# Patient Record
Sex: Male | Born: 1965 | Race: White | Hispanic: No | Marital: Married | Smoking: Never smoker
Health system: Southern US, Community
[De-identification: ages and names within clinical notes are randomized; demographics above are authoritative.]

## PROBLEM LIST (undated history)

## (undated) DIAGNOSIS — I10 Essential (primary) hypertension: Secondary | ICD-10-CM

## (undated) DIAGNOSIS — E785 Hyperlipidemia, unspecified: Secondary | ICD-10-CM

---

## 2005-03-07 ENCOUNTER — Emergency Department (HOSPITAL_COMMUNITY): Admission: EM | Admit: 2005-03-07 | Discharge: 2005-03-08 | Payer: Self-pay | Admitting: *Deleted

## 2005-06-09 ENCOUNTER — Other Ambulatory Visit: Admission: RE | Admit: 2005-06-09 | Discharge: 2005-06-09 | Payer: Self-pay | Admitting: Dermatology

## 2006-09-22 ENCOUNTER — Ambulatory Visit (HOSPITAL_COMMUNITY): Admission: RE | Admit: 2006-09-22 | Discharge: 2006-09-22 | Payer: Self-pay | Admitting: *Deleted

## 2012-09-06 ENCOUNTER — Encounter (INDEPENDENT_AMBULATORY_CARE_PROVIDER_SITE_OTHER): Payer: Self-pay | Admitting: General Surgery

## 2012-09-11 ENCOUNTER — Ambulatory Visit (INDEPENDENT_AMBULATORY_CARE_PROVIDER_SITE_OTHER): Payer: BC Managed Care – PPO | Admitting: General Surgery

## 2012-09-13 ENCOUNTER — Encounter (INDEPENDENT_AMBULATORY_CARE_PROVIDER_SITE_OTHER): Payer: Self-pay | Admitting: General Surgery

## 2012-09-24 ENCOUNTER — Ambulatory Visit (INDEPENDENT_AMBULATORY_CARE_PROVIDER_SITE_OTHER): Payer: BC Managed Care – PPO | Admitting: General Surgery

## 2012-09-24 ENCOUNTER — Encounter (INDEPENDENT_AMBULATORY_CARE_PROVIDER_SITE_OTHER): Payer: Self-pay | Admitting: General Surgery

## 2012-09-24 VITALS — BP 140/98 | HR 78 | Temp 98.2°F | Resp 18 | Ht 69.5 in | Wt 199.0 lb

## 2012-09-24 DIAGNOSIS — K4090 Unilateral inguinal hernia, without obstruction or gangrene, not specified as recurrent: Secondary | ICD-10-CM

## 2012-09-24 NOTE — Progress Notes (Signed)
Patient ID: Russell Cisneros, male   DOB: 03-Dec-1965, 47 y.o.   MRN: [id number]  No chief complaint on file.   HPI Russell Cisneros is a 47 y.o. male.  The patient is a 47 year old male who is referred by Dr. Elfredia Nevins for an evaluation of a right inguinal hernia. Patient states he's had this for several months. It is a burning sensation and on his right inguinal area. He describes no signs or symptoms of incarceration. There is no provoking or relieving factors. 2 HPI  No past medical history on file.  No past surgical history on file.  Family History  Problem Relation Age of Onset  . Heart disease Father     heart attack    Social History History  Substance Use Topics  . Smoking status: Not on file  . Smokeless tobacco: Not on file  . Alcohol Use: Not on file    No Known Allergies  Current Outpatient Prescriptions  Medication Sig Dispense Refill  . aspirin 81 MG tablet Take 81 mg by mouth daily.      . fish oil-omega-3 fatty acids 1000 MG capsule Take 2 g by mouth daily.      . Multiple Vitamins-Minerals (MULTIVITAMIN WITH MINERALS) tablet Take 1 tablet by mouth daily.      . rosuvastatin (CRESTOR) 20 MG tablet Take 20 mg by mouth daily.       No current facility-administered medications for this visit.    Review of Systems Review of Systems  Constitutional: Negative.   Eyes: Negative.   Respiratory: Negative.   Cardiovascular: Negative.   Allergic/Immunologic: Negative.   Neurological: Negative.     Blood pressure 140/98, pulse 78, temperature 98.2 F (36.8 C), temperature source Temporal, resp. rate 18, height 5' 9.5" (1.765 m), weight 199 lb (90.266 kg).  Physical Exam Physical Exam  Constitutional: He is oriented to person, place, and time. He appears well-developed and well-nourished.  HENT:  Head: Normocephalic.  Eyes: Conjunctivae and EOM are normal. Pupils are equal, round, and reactive to light.  Neck: Normal range of motion.  Cardiovascular: Normal  rate and normal heart sounds.   Pulmonary/Chest: Effort normal and breath sounds normal.  Abdominal: Soft. Bowel sounds are normal. A hernia is present. Hernia confirmed positive in the right inguinal area. Hernia confirmed negative in the left inguinal area.  Musculoskeletal: Normal range of motion.  Neurological: He is alert and oriented to person, place, and time.    Data Reviewed none  Assessment    47 year old male with a right likely direct inguinal hernia.     Plan    1. Will proceed to the operating room for laparoscopic right inguinal hernia repair with mesh. 2.All risks and benefits were discussed with the patient, to generally include infection, bleeding, damage to surrounding structures, acute and chronic nerve pain, and recurrence. Alternatives were offered and described.  All questions were answered and the patient voiced understanding of the procedure and wishes to proceed at this point.         Marigene Ehlers., Yousif Edelson 09/24/2012, 3:07 PM

## 2012-10-24 DIAGNOSIS — K4090 Unilateral inguinal hernia, without obstruction or gangrene, not specified as recurrent: Secondary | ICD-10-CM

## 2012-10-24 HISTORY — PX: HERNIA REPAIR: SHX51

## 2012-11-08 ENCOUNTER — Encounter (INDEPENDENT_AMBULATORY_CARE_PROVIDER_SITE_OTHER): Payer: Self-pay | Admitting: General Surgery

## 2012-11-08 ENCOUNTER — Ambulatory Visit (INDEPENDENT_AMBULATORY_CARE_PROVIDER_SITE_OTHER): Payer: BC Managed Care – PPO | Admitting: General Surgery

## 2012-11-08 VITALS — BP 128/84 | HR 84 | Temp 97.8°F | Resp 16 | Ht 69.5 in | Wt 197.6 lb

## 2012-11-08 DIAGNOSIS — D171 Benign lipomatous neoplasm of skin and subcutaneous tissue of trunk: Secondary | ICD-10-CM | POA: Insufficient documentation

## 2012-11-08 DIAGNOSIS — D1779 Benign lipomatous neoplasm of other sites: Secondary | ICD-10-CM

## 2012-11-08 DIAGNOSIS — Z09 Encounter for follow-up examination after completed treatment for conditions other than malignant neoplasm: Secondary | ICD-10-CM

## 2012-11-08 NOTE — Progress Notes (Signed)
Patient ID: Russell Cisneros, male   DOB: 1966-03-15, 47 y.o.   MRN: [id number] The patient is a 47 year old male status post laparoscopic right inguinal hernia repair with mesh. The patient has been doing well postoperatively. He's complained of no burning sensation that he had preoperatively. He is back to work doing well.   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 1.patient can call us when he is ready to have his his left scapular lipoma removed. 2.patient can followup as needed

## 2015-09-03 ENCOUNTER — Encounter (INDEPENDENT_AMBULATORY_CARE_PROVIDER_SITE_OTHER): Payer: Self-pay | Admitting: *Deleted

## 2015-10-07 ENCOUNTER — Emergency Department (HOSPITAL_COMMUNITY): Payer: BLUE CROSS/BLUE SHIELD

## 2015-10-07 ENCOUNTER — Encounter (HOSPITAL_COMMUNITY): Payer: Self-pay | Admitting: *Deleted

## 2015-10-07 ENCOUNTER — Emergency Department (HOSPITAL_COMMUNITY)
Admission: EM | Admit: 2015-10-07 | Discharge: 2015-10-07 | Disposition: A | Discharge status: Home / Self Care | Payer: BLUE CROSS/BLUE SHIELD | Attending: Emergency Medicine | Admitting: Emergency Medicine

## 2015-10-07 DIAGNOSIS — R079 Chest pain, unspecified: Secondary | ICD-10-CM | POA: Diagnosis present

## 2015-10-07 DIAGNOSIS — R200 Anesthesia of skin: Secondary | ICD-10-CM | POA: Diagnosis not present

## 2015-10-07 DIAGNOSIS — E785 Hyperlipidemia, unspecified: Secondary | ICD-10-CM | POA: Insufficient documentation

## 2015-10-07 DIAGNOSIS — I10 Essential (primary) hypertension: Secondary | ICD-10-CM | POA: Diagnosis not present

## 2015-10-07 DIAGNOSIS — Z79899 Other long term (current) drug therapy: Secondary | ICD-10-CM | POA: Insufficient documentation

## 2015-10-07 DIAGNOSIS — Z7982 Long term (current) use of aspirin: Secondary | ICD-10-CM | POA: Diagnosis not present

## 2015-10-07 HISTORY — DX: Hyperlipidemia, unspecified: E78.5

## 2015-10-07 HISTORY — DX: Essential (primary) hypertension: I10

## 2015-10-07 LAB — CBC
HCT: 43.6 % (ref 39.0–52.0)
Hemoglobin: 14.9 g/dL (ref 13.0–17.0)
MCH: 29.7 pg (ref 26.0–34.0)
MCHC: 34.2 g/dL (ref 30.0–36.0)
MCV: 87 fL (ref 78.0–100.0)
Platelets: 253 10*3/uL (ref 150–400)
RBC: 5.01 MIL/uL (ref 4.22–5.81)
RDW: 12.5 % (ref 11.5–15.5)
WBC: 8.8 10*3/uL (ref 4.0–10.5)

## 2015-10-07 LAB — BASIC METABOLIC PANEL
Anion gap: 12 (ref 5–15)
BUN: 12 mg/dL (ref 6–20)
CO2: 24 mmol/L (ref 22–32)
Calcium: 9.7 mg/dL (ref 8.9–10.3)
Chloride: 103 mmol/L (ref 101–111)
Creatinine, Ser: 1.17 mg/dL (ref 0.61–1.24)
GFR calc Af Amer: >60 mL/min (ref >60)
GFR calc non Af Amer: >60 mL/min (ref >60)
Glucose, Bld: 99 mg/dL (ref 65–99)
Potassium: 4.5 mmol/L (ref 3.5–5.1)
Sodium: 139 mmol/L (ref 135–145)

## 2015-10-07 LAB — I-STAT TROPONIN, ED: Troponin i, poc: 0 ng/mL (ref 0.00–0.08)

## 2015-10-07 NOTE — ED Notes (Signed)
Patient presents stating he had numbness in his left arm earlier today and just prior to coming to the back. Numbness gone at this time.  Also stated he had a burning pain to the right chest that radated to the left chest and then went away.  Wife stated he had a lot of belching during that time

## 2015-10-07 NOTE — ED Notes (Signed)
Discharge instructions reviewed, voiced understanding.  

## 2015-10-07 NOTE — ED Notes (Signed)
Pt states 1 week ago he started experiencing left arm numbness. States a couple days ago his left left started going numb. States now his right leg is also asleep. Started having chest tightness today. States he has had phlegm in his throat that he cannot cough up as well.

## 2015-10-07 NOTE — ED Notes (Signed)
Pt normal neuro exam. Equal grip strength, equal sensation and symmetry.

## 2015-10-07 NOTE — ED Provider Notes (Signed)
CSN: [id number]     Arrival date & time 10/07/15  1814 History   First MD Initiated Contact with Patient 10/07/15 2025     Chief Complaint  Patient presents with  . Numbness  . Chest Pain   Patient is a 50 y.o. male presenting with chest pain. The history is provided by the patient and the spouse.  Chest Pain Pain location:  Substernal area Pain quality: burning   Pain radiates to:  Does not radiate Pain radiates to the back: no   Pain severity:  Mild Onset quality:  Gradual Duration:  1 minute Timing:  Rare Progression:  Resolved Chronicity:  New Context: at rest   Context: no movement and not raising an arm   Relieved by:  None tried Worsened by:  Nothing tried Ineffective treatments:  None tried Associated symptoms: anxiety and numbness   Associated symptoms: no abdominal pain, no altered mental status, no back pain, no claudication, no cough, no dizziness, no dysphagia, no fever, no headache, no lower extremity edema, no nausea, no palpitations, no shortness of breath, not vomiting and no weakness   Risk factors: high cholesterol, hypertension and male sex   Risk factors: no aortic disease, no coronary artery disease, no prior DVT/PE, no smoking and no surgery     Past Medical History  Diagnosis Date  . Hypertension   . Hyperlipidemia    Past Surgical History  Procedure Laterality Date  . Hernia repair Right 10/24/12    RIH   Family History  Problem Relation Age of Onset  . Heart disease Father     heart attack   Social History  Substance Use Topics  . Smoking status: Never Smoker   . Smokeless tobacco: None  . Alcohol Use: No    Review of Systems  Constitutional: Negative for fever, chills, activity change and appetite change.  HENT: Negative for congestion, dental problem, ear pain, facial swelling, hearing loss, rhinorrhea, sneezing, sore throat, trouble swallowing and voice change.   Eyes: Negative for photophobia, pain, redness and visual disturbance.   Respiratory: Negative for apnea, cough, chest tightness, shortness of breath, wheezing and stridor.   Cardiovascular: Positive for chest pain. Negative for palpitations, claudication and leg swelling.  Gastrointestinal: Negative for nausea, vomiting, abdominal pain, diarrhea, constipation, blood in stool and abdominal distention.  Endocrine: Negative for polydipsia and polyuria.  Genitourinary: Negative for frequency, hematuria, flank pain, decreased urine volume and difficulty urinating.  Musculoskeletal: Negative for back pain, joint swelling, gait problem, neck pain and neck stiffness.  Skin: Negative for rash and wound.  Allergic/Immunologic: Negative for immunocompromised state.  Neurological: Positive for numbness. Negative for dizziness, syncope, facial asymmetry, speech difficulty, weakness, light-headedness and headaches.  Hematological: Negative for adenopathy.  Psychiatric/Behavioral: Negative for suicidal ideas, behavioral problems, confusion, sleep disturbance and agitation. The patient is not nervous/anxious.   All other systems reviewed and are negative.     Allergies  Review of patient's allergies indicates no known allergies.  Home Medications   Prior to Admission medications   Medication Sig Start Date End Date Taking? Authorizing Provider  acetaminophen (TYLENOL) 500 MG tablet Take 1,500 mg by mouth every 6 (six) hours as needed for mild pain or headache.   Yes Historical Provider, MD  aspirin 81 MG tablet Take 81 mg by mouth daily.   Yes Historical Provider, MD  Aspirin Effervescent (ALKA-SELTZER PO) Take 2 tablets by mouth daily as needed (indigestion).   Yes Historical Provider, MD  fish oil-omega-3 fatty acids 1000  MG capsule Take 2 g by mouth daily.   Yes Historical Provider, MD  ibuprofen (ADVIL,MOTRIN) 200 MG tablet Take 600 mg by mouth every 6 (six) hours as needed for mild pain.   Yes Historical Provider, MD  lisinopril (PRINIVIL,ZESTRIL) 5 MG tablet Take 5  mg by mouth daily. 07/22/15  Yes Historical Provider, MD  Multiple Vitamins-Minerals (MULTIVITAMIN WITH MINERALS) tablet Take 1 tablet by mouth daily.   Yes Historical Provider, MD  rosuvastatin (CRESTOR) 20 MG tablet Take 20 mg by mouth daily.   Yes Historical Provider, MD   BP 135/84 mmHg  Pulse 83  Temp(Src) 98 F (36.7 C) (Oral)  Resp 17  Ht 5\' 9"  (1.753 m)  Wt 89.858 kg  BMI 29.24 kg/m2  SpO2 98% Physical Exam  Constitutional: He is oriented to person, place, and time. He appears well-developed and well-nourished. No distress.  HENT:  Head: Normocephalic and atraumatic.  Right Ear: External ear normal.  Left Ear: External ear normal.  Eyes: Pupils are equal, round, and reactive to light. Right eye exhibits no discharge. Left eye exhibits no discharge.  Neck: Normal range of motion. No JVD present. No tracheal deviation present.  Cardiovascular: Normal rate, regular rhythm and normal heart sounds.  Exam reveals no friction rub.   No murmur heard. Pulmonary/Chest: Effort normal and breath sounds normal. No stridor. No respiratory distress. He has no wheezes.  Abdominal: Soft. Bowel sounds are normal. He exhibits no distension. There is no rebound and no guarding.  Musculoskeletal: Normal range of motion. He exhibits no edema or tenderness.  Lymphadenopathy:    He has no cervical adenopathy.  Neurological: He is alert and oriented to person, place, and time. No cranial nerve deficit. Coordination normal.  Skin: Skin is warm and dry. No rash noted. No pallor.  Psychiatric: He has a normal mood and affect. His behavior is normal. Judgment and thought content normal.  Nursing note and vitals reviewed.   ED Course  Procedures (including critical care time) Labs Review Labs Reviewed  BASIC METABOLIC PANEL  [place], ED    Imaging Review Dg Chest 2 View  10/07/2015  CLINICAL DATA:  Left arm numbness and tingling for 10 days. Burning sensation across the chest for  1 day. History of hypertension. EXAM: CHEST  2 VIEW COMPARISON:  09/22/2006 FINDINGS: The heart size and mediastinal contours are within normal limits. Minimal linear scarring in the left lung base. Lungs otherwise clear. No pleural effusion or pneumothorax. The visualized skeletal structures are unremarkable. IMPRESSION: No active cardiopulmonary disease. Electronically Signed   By: Lajean Manes M.D.   On: 10/07/2015 18:52   I have personally reviewed and evaluated these images and lab results as part of my medical decision-making.   EKG Interpretation   Date/Time:  Wednesday October 07 2015 18:20:37 EST Ventricular Rate:  93 PR Interval:  182 QRS Duration: 74 QT Interval:  338 QTC Calculation: 420 R Axis:   68 Text Interpretation:  Normal sinus rhythm Normal ECG No old tracing to  compare Confirmed by KNAPP  MD-J, JON [id number]) on 10/07/2015 8:59:29 PM      MDM   Final diagnoses:  Chest pain, unspecified chest pain type    Patient presented to emergency department tonight for evaluation of burning chest pain and left arm numbness. Patient states symptoms started with left arm numbness intermittently over the past week. It improves on its own without any intervention. He had 1 minute episode of burning chest pain at  noon today. He told his wife about this in conjunction with left arm numbness and his pain is out of the ED for evaluation.  His symptoms have completely resolved at this point. Vital signs stable. Patient has good distal pulses bilaterally. Normal strength and sensation bilateral upper and lower extremities. Normal cranial nerve exam.  EKG normal sinus rhythm with no ischemic changes. Troponin 0.00, BMP, CBC unremarkable. Chest x-ray with no acute cardio pulmonary disease.  Do not suspect CVA, TIA, ACS, pericarditis, dissection. It is possible burning pain was GERD. Also on the differential is musculoskeletal pain vs. costochondritis versus anxiety.  Patient's hear score  low. No return of chest pain since noon so okay with one troponin.  Patient has primary care physician with whom he is able to follow-up with. Return precautions given for worsening or changing chest pain, shortness of breath. Patient and wife at bedside was understanding agreement treatment plan.  Discussed case with my attending Dr. Tomi Bamberger.    Vira Blanco, MD 10/07/15 2138  Dorie Rank, MD 10/09/15 [phone]

## 2015-10-07 NOTE — ED Notes (Signed)
Documentation on patient regarding called report was documented on the incorrect patient

## 2015-10-07 NOTE — Discharge Instructions (Signed)

## 2015-10-19 ENCOUNTER — Ambulatory Visit (INDEPENDENT_AMBULATORY_CARE_PROVIDER_SITE_OTHER): Payer: BLUE CROSS/BLUE SHIELD | Admitting: Internal Medicine

## 2015-10-19 ENCOUNTER — Encounter: Payer: Self-pay | Admitting: Internal Medicine

## 2015-10-19 VITALS — BP 122/74 | HR 94 | Ht 69.0 in | Wt 197.0 lb

## 2015-10-19 DIAGNOSIS — R0789 Other chest pain: Secondary | ICD-10-CM

## 2015-10-19 DIAGNOSIS — E785 Hyperlipidemia, unspecified: Secondary | ICD-10-CM

## 2015-10-19 MED ORDER — NITROGLYCERIN 0.4 MG SL SUBL: 0.4000 mg | SUBLINGUAL_TABLET | SUBLINGUAL | Status: AC | PRN

## 2015-10-19 NOTE — Patient Instructions (Signed)
Your physician recommends that you schedule a follow-up appointment in:  To be determined   Nitroglycerin sublingual tablets What is this medicine? NITROGLYCERIN (nye troe GLI ser in) is a type of vasodilator. It relaxes blood vessels, increasing the blood and oxygen supply to your heart. This medicine is used to relieve chest pain caused by angina. It is also used to prevent chest pain before activities like climbing stairs, going outdoors in cold weather, or sexual activity. This medicine may be used for other purposes; ask your health care provider or pharmacist if you have questions. What should I tell my health care provider before I take this medicine? They need to know if you have any of these conditions: -anemia -head injury, recent stroke, or bleeding in the brain -liver disease -previous heart attack -an unusual or allergic reaction to nitroglycerin, other medicines, foods, dyes, or preservatives -pregnant or trying to get pregnant -breast-feeding How should I use this medicine? Take this medicine by mouth as needed. At the first sign of an angina attack (chest pain or tightness) place one tablet under your tongue. You can also take this medicine 5 to 10 minutes before an event likely to produce chest pain. Follow the directions on the prescription label. Let the tablet dissolve under the tongue. Do not swallow whole. Replace the dose if you accidentally swallow it. It will help if your mouth is not dry. Saliva around the tablet will help it to dissolve more quickly. Do not eat or drink, smoke or chew tobacco while a tablet is dissolving. If you are not better within 5 minutes after taking ONE dose of nitroglycerin, call 9-1-1 immediately to seek emergency medical care. Do not take more than 3 nitroglycerin tablets over 15 minutes. If you take this medicine often to relieve symptoms of angina, your doctor or health care professional may provide you with different instructions to manage  your symptoms. If symptoms do not go away after following these instructions, it is important to call 9-1-1 immediately. Do not take more than 3 nitroglycerin tablets over 15 minutes. Talk to your pediatrician regarding the use of this medicine in children. Special care may be needed. Overdosage: If you think you have taken too much of this medicine contact a poison control center or emergency room at once. NOTE: This medicine is only for you. Do not share this medicine with others. What if I miss a dose? This does not apply. This medicine is only used as needed. What may interact with this medicine? Do not take this medicine with any of the following medications: -certain migraine medicines like ergotamine and dihydroergotamine (DHE) -medicines used to treat erectile dysfunction like sildenafil, tadalafil, and vardenafil -riociguat This medicine may also interact with the following medications: -alteplase -aspirin -heparin -medicines for high blood pressure -medicines for mental depression -other medicines used to treat angina -phenothiazines like chlorpromazine, mesoridazine, prochlorperazine, thioridazine This list may not describe all possible interactions. Give your health care provider a list of all the medicines, herbs, non-prescription drugs, or dietary supplements you use. Also tell them if you smoke, drink alcohol, or use illegal drugs. Some items may interact with your medicine. What should I watch for while using this medicine? Tell your doctor or health care professional if you feel your medicine is no longer working. Keep this medicine with you at all times. Sit or lie down when you take your medicine to prevent falling if you feel dizzy or faint after using it. Try to remain calm. This  will help you to feel better faster. If you feel dizzy, take several deep breaths and lie down with your feet propped up, or bend forward with your head resting between your knees. You may get  drowsy or dizzy. Do not drive, use machinery, or do anything that needs mental alertness until you know how this drug affects you. Do not stand or sit up quickly, especially if you are an older patient. This reduces the risk of dizzy or fainting spells. Alcohol can make you more drowsy and dizzy. Avoid alcoholic drinks. Do not treat yourself for coughs, colds, or pain while you are taking this medicine without asking your doctor or health care professional for advice. Some ingredients may increase your blood pressure. What side effects may I notice from receiving this medicine? Side effects that you should report to your doctor or health care professional as soon as possible: -blurred vision -dry mouth -skin rash -sweating -the feeling of extreme pressure in the head -unusually weak or tired Side effects that usually do not require medical attention (report to your doctor or health care professional if they continue or are bothersome): -flushing of the face or neck -headache -irregular heartbeat, palpitations -nausea, vomiting This list may not describe all possible side effects. Call your doctor for medical advice about side effects. You may report side effects to FDA at 1-800-FDA-1088. Where should I keep my medicine? Keep out of the reach of children. Store at room temperature between 20 and 25 degrees C (68 and 77 degrees F). Store in [facility] [employer]. Protect from light and moisture. Keep tightly closed. Throw away any unused medicine after the expiration date. NOTE: This sheet is a summary. It may not cover all possible information. If you have questions about this medicine, talk to your doctor, pharmacist, or health care provider.    2016, Elsevier/Gold Standard. (2013-05-23 17:57:36)       Thank you for choosing [facility] !

## 2015-10-19 NOTE — Progress Notes (Signed)
Cardiology Office Note   Date:  10/19/2015   ID:  Russell Cisneros, DOB [birth year], MRN [id number]  PCP:  Glo Herring., MD  Cardiologist:   Dorris Carnes, MD    Pt presents for eval of CP   History of Present Illness: Russell Cisneros is a 50 y.o. male with a history of CP Pt seen in ER on 3/1  CP burning  L arm numbness.  Numbnes intermitt for 1 wk .Hurt R shoulder the week prior  1 min of burning CP on day of ER visit   At age 34 did not feel like self  Dr Melvern Banker  Got stress test  [place] didn't feel good  Had cath which he reports looks good    Activity OK  Drives truck  Breathing is good No SOB  Up/down out of truck   Has been taking acid inhibitor for past week     Outpatient Prescriptions Prior to Visit  Medication Sig Dispense Refill  . acetaminophen (TYLENOL) 500 MG tablet Take 1,500 mg by mouth every 6 (six) hours as needed for mild pain or headache.    Marland Kitchen aspirin 81 MG tablet Take 81 mg by mouth daily.    . fish oil-omega-3 fatty acids 1000 MG capsule Take 2 g by mouth daily.    Marland Kitchen ibuprofen (ADVIL,MOTRIN) 200 MG tablet Take 600 mg by mouth every 6 (six) hours as needed for mild pain.    Marland Kitchen lisinopril (PRINIVIL,ZESTRIL) 5 MG tablet Take 5 mg by mouth daily.  0  . Multiple Vitamins-Minerals (MULTIVITAMIN WITH MINERALS) tablet Take 1 tablet by mouth daily.    . rosuvastatin (CRESTOR) 20 MG tablet Take 20 mg by mouth daily.    . Aspirin Effervescent (ALKA-SELTZER PO) Take 2 tablets by mouth daily as needed (indigestion).     No facility-administered medications prior to visit.     Allergies:   Review of patient's allergies indicates no known allergies.   Past Medical History  Diagnosis Date  . Hypertension   . Hyperlipidemia     Past Surgical History  Procedure Laterality Date  . Hernia repair Right 10/24/12    RIH     Social History:  The patient  reports that he has never smoked. He does not have any smokeless tobacco history on file. He reports that he  does not drink alcohol or use illicit drugs.   Family History:  The patient's family history includes Heart disease in his father. 2 brothes died of heart problems  They were smokers     ROS:  Please see the history of present illness. All other systems are reviewed and  Negative to the above problem except as noted.    PHYSICAL EXAM: VS:  BP 122/74 mmHg  Pulse 94  Ht 5\' 9"  (1.753 m)  Wt 197 lb (89.359 kg)  BMI 29.08 kg/m2  SpO2 98%  GEN: Well nourished, well developed, in no acute distress HEENT: normal Neck: no JVD, carotid bruits, or masses Cardiac: RRR; no murmurs, rubs, or gallops,no edema  Respiratory:  clear to auscultation bilaterally, normal work of breathing GI: soft, nontender, nondistended, + BS  No hepatomegaly  MS: no deformity Moving all extremities   Skin: warm and dry, no rash Neuro:  Strength and sensation are intact Psych: euthymic mood, full affect   EKG:  EKG is ordered today. On 3/1 NSR    Lipid Panel No results found for: CHOL, TRIG, HDL, CHOLHDL, VLDL, LDLCALC, LDLDIRECT    Wt  Readings from Last 3 Encounters:  10/19/15 197 lb (89.359 kg)  10/07/15 198 lb 1.6 oz (89.858 kg)  11/08/12 197 lb 9.6 oz (89.631 kg)      ASSESSMENT AND PLAN: 1  CP  Very atypical  L arm  numbness also is atypical  Arm discomfort may be related to R arm injury I will get cath report from 10 years ago  Get labs from Dr Gerarda Fraction  Any further testing based on this review  COntinue activities as tolerated  Rx for NTG given  Pt admits to being anxious     Disposition:   FU will be based on test results  Signed, Dorris Carnes, MD  10/19/2015 2:19 PM    [address], [place], [place]  [address] Phone: [phone]; Fax: [phone]

## 2015-10-23 ENCOUNTER — Ambulatory Visit: Payer: BLUE CROSS/BLUE SHIELD | Admitting: Internal Medicine

## 2017-08-24 IMAGING — CR DG CHEST 2V
2 series · 2 of 2 positions shown · non-contrast
Comparison: 09/22/2006

CLINICAL DATA: Left arm numbness and tingling for 10 days. Burning
sensation across the chest for 1 day. History of hypertension.

EXAM:
CHEST  2 VIEW

[chest pa]
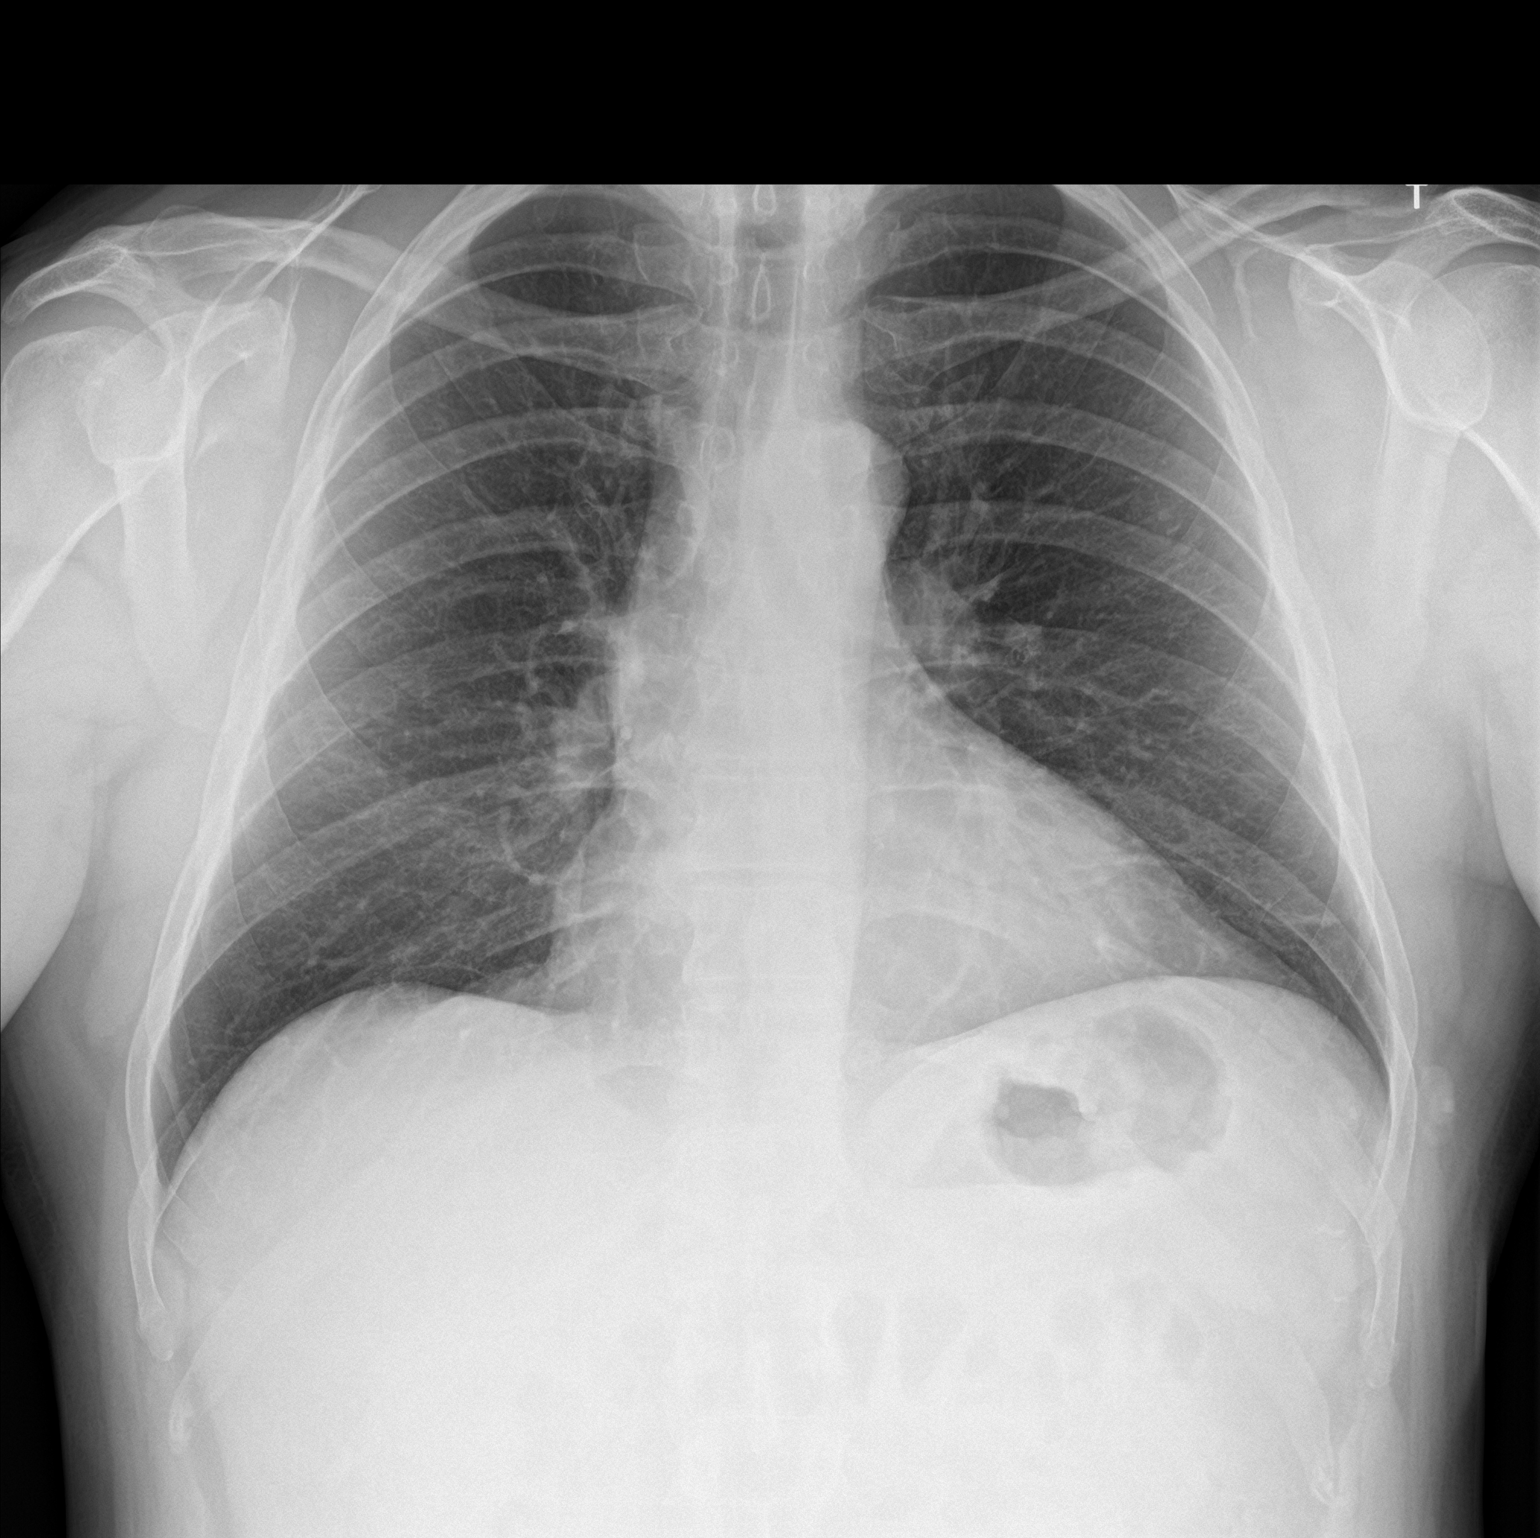

[chest lat]
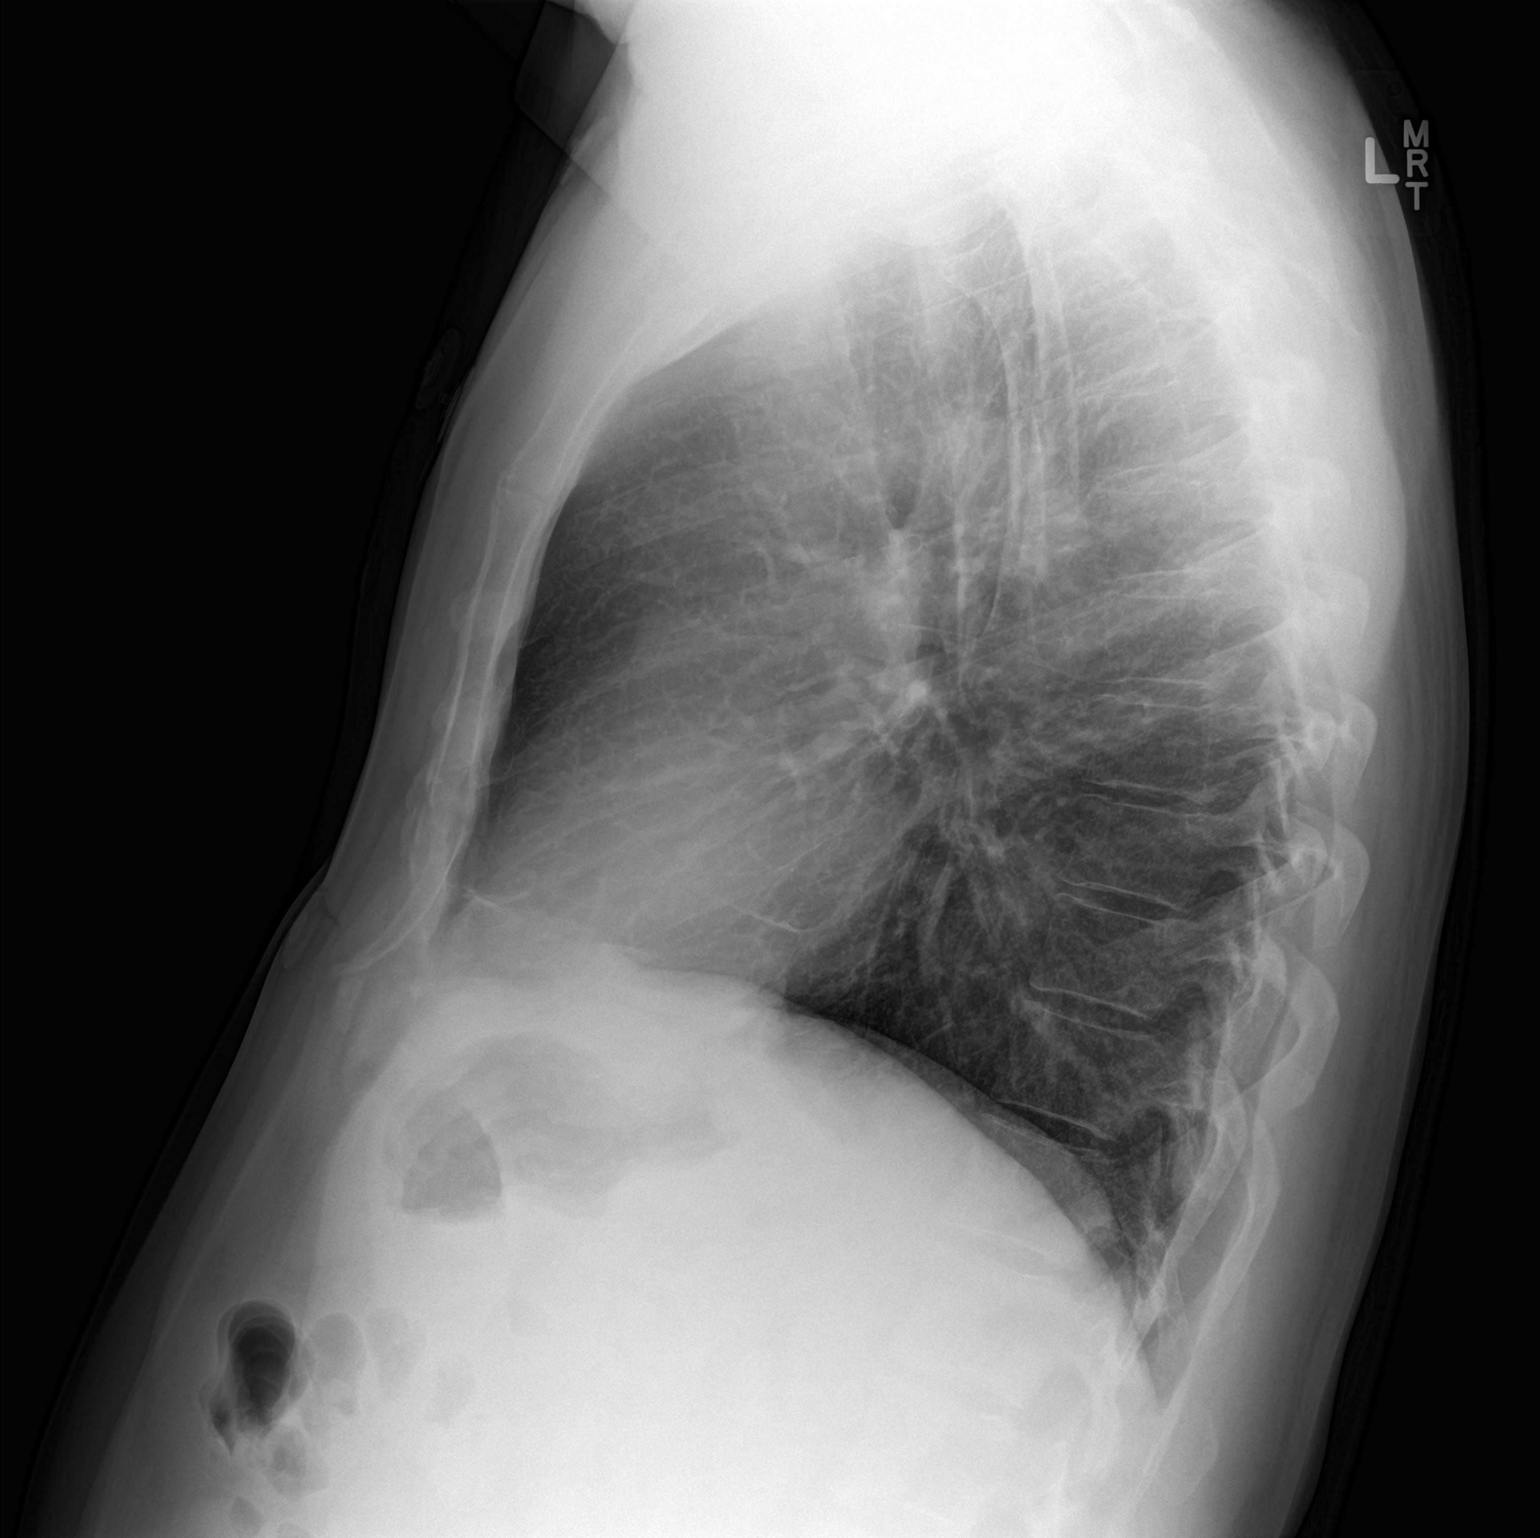

[2 of 2 positions shown; findings below may reference images not displayed]

FINDINGS: The heart size and mediastinal contours are within normal limits.
Minimal linear scarring in the left lung base. Lungs otherwise
clear. No pleural effusion or pneumothorax. The visualized skeletal
structures are unremarkable.
IMPRESSION: No active cardiopulmonary disease.

## 2020-03-30 ENCOUNTER — Ambulatory Visit
Admission: EM | Admit: 2020-03-30 | Discharge: 2020-03-30 | Disposition: A | Discharge status: Home / Self Care | Payer: Managed Care, Other (non HMO) | Attending: Emergency Medicine | Admitting: Emergency Medicine

## 2020-03-30 ENCOUNTER — Other Ambulatory Visit: Payer: Self-pay

## 2020-03-30 DIAGNOSIS — R05 Cough: Secondary | ICD-10-CM

## 2020-03-30 DIAGNOSIS — R059 Cough, unspecified: Secondary | ICD-10-CM

## 2020-03-30 DIAGNOSIS — J0101 Acute recurrent maxillary sinusitis: Secondary | ICD-10-CM | POA: Diagnosis not present

## 2020-03-30 MED ORDER — DEXAMETHASONE SODIUM PHOSPHATE 10 MG/ML IJ SOLN
10.0000 mg | Freq: Once | INTRAMUSCULAR | Status: AC
  Administered 2020-03-30: 10 mg via INTRAMUSCULAR

## 2020-03-30 MED ORDER — AMOXICILLIN-POT CLAVULANATE 875-125 MG PO TABS: 1.0000 | ORAL_TABLET | Freq: Two times a day (BID) | ORAL | 0 refills | Status: DC

## 2020-03-30 MED ORDER — BENZONATATE 100 MG PO CAPS: 100.0000 mg | ORAL_CAPSULE | Freq: Three times a day (TID) | ORAL | 0 refills | Status: DC

## 2020-03-30 NOTE — ED Provider Notes (Signed)
[facility]   [id number] 03/30/20 Arrival Time: 6712   CC: COVID symptoms  SUBJECTIVE: History from: patient.  Russell Cisneros is a 54 y.o. male who presented to the urgent care with a complaint of sinus pressure and sinus pain with nasal congestion  for the past 2 weeks.  Reported cough 3 days ago.  Denies sick exposure to COVID, flu or strep.  Denies recent travel.  Has tried OTC medication without relief.  Reports symptom has been getting worse.  Report previous symptoms in the past.   Denies fever, chills, fatigue, sinus pain, rhinorrhea, sore throat, SOB, wheezing, chest pain, nausea, changes in bowel or bladder habits.    ROS: As per HPI.  All other pertinent ROS negative.     Past Medical History:  Diagnosis Date  . Hyperlipidemia   . Hypertension    Past Surgical History:  Procedure Laterality Date  . HERNIA REPAIR Right 10/24/12   [facility]   No Known Allergies No current facility-administered medications on file prior to encounter.   Current Outpatient Medications on File Prior to Encounter  Medication Sig Dispense Refill  . acetaminophen (TYLENOL) 500 MG tablet Take 1,500 mg by mouth every 6 (six) hours as needed for mild pain or headache.    Marland Kitchen aspirin 81 MG tablet Take 81 mg by mouth daily.    . fish oil-omega-3 fatty acids 1000 MG capsule Take 2 g by mouth daily.    Marland Kitchen ibuprofen (ADVIL,MOTRIN) 200 MG tablet Take 600 mg by mouth every 6 (six) hours as needed for mild pain.    Marland Kitchen lisinopril (PRINIVIL,ZESTRIL) 5 MG tablet Take 5 mg by mouth daily.  0  . Multiple Vitamins-Minerals (MULTIVITAMIN WITH MINERALS) tablet Take 1 tablet by mouth daily.    . nitroGLYCERIN (NITROSTAT) 0.4 MG SL tablet Place 1 tablet (0.4 mg total) under the tongue every 5 (five) minutes as needed. 25 tablet 3  . rosuvastatin (CRESTOR) 20 MG tablet Take 20 mg by mouth daily.     Social History   Socioeconomic History  . Marital status: Married    Spouse name: Not on file  . Number of  children: Not on file  . Years of education: Not on file  . Highest education level: Not on file  Occupational History  . Not on file  Tobacco Use  . Smoking status: Never Smoker  Substance and Sexual Activity  . Alcohol use: No  . Drug use: No  . Sexual activity: Not on file  Other Topics Concern  . Not on file  Social History Narrative  . Not on file   Social Determinants of Health   Financial Resource Strain:   . Difficulty of Paying Living Expenses: Not on file  Food Insecurity:   . Worried About Charity fundraiser in the Last Year: Not on file  . Ran Out of Food in the Last Year: Not on file  Transportation Needs:   . Lack of Transportation (Medical): Not on file  . Lack of Transportation (Non-Medical): Not on file  Physical Activity:   . Days of Exercise per Week: Not on file  . Minutes of Exercise per Session: Not on file  Stress:   . Feeling of Stress : Not on file  Social Connections:   . Frequency of Communication with Friends and Family: Not on file  . Frequency of Social Gatherings with Friends and Family: Not on file  . Attends Religious Services: Not on file  . Active Member of  Clubs or Organizations: Not on file  . Attends Archivist Meetings: Not on file  . Marital Status: Not on file  Intimate Partner Violence:   . Fear of Current or Ex-Partner: Not on file  . Emotionally Abused: Not on file  . Physically Abused: Not on file  . Sexually Abused: Not on file   Family History  Problem Relation Age of Onset  . Heart disease Father        heart attack    OBJECTIVE:  Vitals:   03/30/20 1012  BP: 130/87  Pulse: 90  Resp: 16  Temp: 99.6 F (37.6 C)  TempSrc: Oral  SpO2: 98%     Physical Exam Vitals and nursing note reviewed.  Constitutional:      General: He is not in acute distress.    Appearance: Normal appearance. He is normal weight. He is not ill-appearing, toxic-appearing or diaphoretic.  HENT:     Head: Normocephalic.      Right Ear: Tympanic membrane, ear canal and external ear normal. There is no impacted cerumen.     Left Ear: Tympanic membrane, ear canal and external ear normal. There is no impacted cerumen.     Nose: Congestion present.     Right Sinus: Maxillary sinus tenderness present.     Left Sinus: Maxillary sinus tenderness present.     Mouth/Throat:     Mouth: Mucous membranes are moist.     Pharynx: No oropharyngeal exudate or posterior oropharyngeal erythema.  Cardiovascular:     Rate and Rhythm: Normal rate and regular rhythm.     Pulses: Normal pulses.     Heart sounds: Normal heart sounds. No murmur heard.  No friction rub. No gallop.   Pulmonary:     Effort: Pulmonary effort is normal. No respiratory distress.     Breath sounds: Normal breath sounds. No stridor. No wheezing, rhonchi or rales.  Chest:     Chest wall: No tenderness.  Neurological:     Mental Status: He is alert and oriented to person, place, and time.      LABS:  No results found for this or any previous visit (from the past 24 hour(s)).   ASSESSMENT & PLAN:  1. Acute recurrent maxillary sinusitis   2. Cough     Meds ordered this encounter  Medications  . dexamethasone (DECADRON) injection 10 mg  . amoxicillin-clavulanate (AUGMENTIN) 875-125 MG tablet    Sig: Take 1 tablet by mouth every 12 (twelve) hours.    Dispense:  14 tablet    Refill:  0  . benzonatate (TESSALON) 100 MG capsule    Sig: Take 1 capsule (100 mg total) by mouth every 8 (eight) hours.    Dispense:  21 capsule    Refill:  0    Discharge Instructions   Rest and push fluids Tessalon prescribed for cough Augmentin prescribed.  Take as directed and to completion Continue with OTC ibuprofen/tylenol as needed for pain Follow up with PCP or [facility] if symptoms persists Return or go to the ED if you have any new or worsening symptoms such as fever, chills, worsening sinus pain/pressure, cough, sore throat, chest pain, shortness  of breath, abdominal pain, changes in bowel or bladder habits, etc...  Reviewed expectations re: course of current medical issues. Questions answered. Outlined signs and symptoms indicating need for more acute intervention. Patient verbalized understanding. After Visit Summary given.      Note: This document was prepared using Systems analyst  and may include unintentional dictation errors.    Emerson Monte, FNP 03/30/20 1108

## 2020-03-30 NOTE — Discharge Instructions (Addendum)
Rest and push fluids Tessalon prescribed for cough Augmentin prescribed.  Take as directed and to completion Continue with OTC ibuprofen/tylenol as needed for pain Follow up with PCP or [facility] if symptoms persists Return or go to the ED if you have any new or worsening symptoms such as fever, chills, worsening sinus pain/pressure, cough, sore throat, chest pain, shortness of breath, abdominal pain, changes in bowel or bladder habits, etc..Marland Kitchen

## 2020-03-30 NOTE — ED Triage Notes (Signed)
Pt presents with nasal congestion for past 2 weeks, cough began 3 days ago

## 2020-09-30 ENCOUNTER — Encounter: Payer: Self-pay | Admitting: *Deleted

## 2020-10-29 ENCOUNTER — Ambulatory Visit (INDEPENDENT_AMBULATORY_CARE_PROVIDER_SITE_OTHER): Payer: Self-pay | Admitting: *Deleted

## 2020-10-29 ENCOUNTER — Other Ambulatory Visit: Payer: Self-pay

## 2020-10-29 VITALS — Ht 69.5 in | Wt 203.2 lb

## 2020-10-29 DIAGNOSIS — Z1211 Encounter for screening for malignant neoplasm of colon: Secondary | ICD-10-CM

## 2020-10-29 MED ORDER — NA SULFATE-K SULFATE-MG SULF 17.5-3.13-1.6 GM/177ML PO SOLN: 1.0000 | Freq: Once | ORAL | 0 refills | Status: AC

## 2020-10-29 NOTE — Patient Instructions (Addendum)
Russell Cisneros  11/15/1965 MRN: [id number]    Covid test; 12/04/2020 at 3:45 PM.   Procedure Date: 12/07/2020 Time to register: You will receive a call from the hospital a few days before your procedure. Place to register:[facility] Scheduled provider: Dr. Abbey Chatters    PREPARATION FOR COLONOSCOPY WITH SUPREP BOWEL PREP KIT  Note: Suprep Bowel Prep Kit is a split-dose (2day) regimen. Consumption of BOTH 6-ounce bottles is required for a complete prep.  Please notify us immediately if you are diabetic, take iron supplements, or if you are on Coumadin or any other blood thinners.  Please hold the following medications: n/a                                                                                                                                                  2 DAYS BEFORE PROCEDURE:  DATE: 12/05/2020   DAY: Saturday Begin clear liquid diet AFTER your lunch meal. NO SOLID FOODS after this point.  1 DAY BEFORE PROCEDURE:  DATE: 12/06/2020   DAY: Sunday Continue clear liquids the entire day - NO SOLID FOOD.   Diabetic medications adjustments for today: n/a  At 8:00am: Complete steps 1 through 4 below, using ONE (1) 6-ounce bottle, before going to bed. Step 1:  Pour ONE (1) 6-ounce bottle of SUPREP liquid into the mixing container.  Step 2:  Add cool drinking water to the 16 ounce line on the container and mix.  Note: Dilute the solution concentrate as directed prior to use. Step 3:  DRINK ALL the liquid in the container. Step 4:  You MUST drink an additional two (2) or more 16 ounce containers of water over the next one (1) hour.    At 6:00pm: Complete steps 1 through 4 below, using ONE (1) 6-ounce bottle, before going to bed. Step 1:  Pour ONE (1) 6-ounce bottle of SUPREP liquid into the mixing container.  Step 2:  Add cool drinking water to the 16 ounce line on the container and mix.  Note: Dilute the solution concentrate as directed prior to use. Step 3:  DRINK ALL the  liquid in the container. Step 4:  You MUST drink an additional two (2) or more 16 ounce containers of water over the next one (1) hour.   Continue clear liquids until midnight.  Nothing by mouth after midnight.  DAY OF PROCEDURE:   DATE: 12/07/2020   DAY: Monday If you take medications for your heart, blood pressure, or breathing, you may take these medications.  Diabetic medications adjustments for today: n/a  You may take your morning medications with sip of water unless we have instructed otherwise.    Please see below for Dietary Information.  CLEAR LIQUIDS INCLUDE:  Water Jello (NOT red in color)   Ice Popsicles (NOT red in color)   Tea (sugar ok,  no milk/cream) Powdered fruit flavored drinks  Coffee (sugar ok, no milk/cream) Gatorade/ Lemonade/ Kool-Aid  (NOT red in color)   Juice: apple, white grape, white cranberry Soft drinks  Clear bullion, consomme, broth (fat free beef/chicken/vegetable)  Carbonated beverages (any kind)  Strained chicken noodle soup Hard Candy   Remember: Clear liquids are liquids that will allow you to see your fingers on the other side of a clear glass. Be sure liquids are NOT red in color, and not cloudy, but CLEAR.  DO NOT EAT OR DRINK ANY OF THE FOLLOWING:  Dairy products of any kind   Cranberry juice Tomato juice / V8 juice   Grapefruit juice Orange juice     Red grape juice  Do not eat any solid foods, including such foods as: cereal, oatmeal, yogurt, fruits, vegetables, creamed soups, eggs, bread, crackers, pureed foods in a blender, etc.   HELPFUL HINTS FOR DRINKING PREP SOLUTION:   Make sure prep is extremely cold. Mix and refrigerate the the morning of the prep. You may also put in the freezer.   You may try mixing some Crystal Light or Country Time Lemonade if you prefer. Mix in small amounts; add more if necessary.  Try drinking through a straw  Rinse mouth with water or a mouthwash between glasses, to remove after-taste.  Try  sipping on a cold beverage /ice/ popsicles between glasses of prep.  Place a piece of sugar-free hard candy in mouth between glasses.  If you become nauseated, try consuming smaller amounts, or stretch out the time between glasses. Stop for 30-60 minutes, then slowly start back drinking.     OTHER INSTRUCTIONS  You will need a responsible adult at least 56 years of age to accompany you and drive you home. This person must remain in the waiting room during your procedure. The hospital will cancel your procedure if you do not have a responsible adult with you.   1. Wear loose fitting clothing that is easily removed. 2. Leave jewelry and other valuables at home.  3. Remove all body piercing jewelry and leave at home. 4. Total time from sign-in until discharge is approximately 2-3 hours. 5. You should go home directly after your procedure and rest. You can resume normal activities the day after your procedure. 6. The day of your procedure you should not:  Drive  Make legal decisions  Operate machinery  Drink alcohol  Return to work   You may call the office (Dept: [phone]) before 5:00pm, or page the doctor on call [phone]) after 5:00pm, for further instructions, if necessary.   Insurance Information YOU WILL NEED TO CHECK WITH YOUR INSURANCE COMPANY FOR THE BENEFITS OF COVERAGE YOU HAVE FOR THIS PROCEDURE.  UNFORTUNATELY, NOT ALL INSURANCE COMPANIES HAVE BENEFITS TO COVER ALL OR PART OF THESE TYPES OF PROCEDURES.  IT IS YOUR RESPONSIBILITY TO CHECK YOUR BENEFITS, HOWEVER, WE WILL BE GLAD TO ASSIST YOU WITH ANY CODES YOUR INSURANCE COMPANY MAY NEED.    PLEASE NOTE THAT MOST INSURANCE COMPANIES WILL NOT COVER A SCREENING COLONOSCOPY FOR PEOPLE UNDER THE AGE OF 50  IF YOU HAVE BCBS INSURANCE, YOU MAY HAVE BENEFITS FOR A SCREENING COLONOSCOPY BUT IF POLYPS ARE FOUND THE DIAGNOSIS WILL CHANGE AND THEN YOU MAY HAVE A DEDUCTIBLE THAT WILL NEED TO BE MET. SO PLEASE MAKE SURE YOU  CHECK YOUR BENEFITS FOR A SCREENING COLONOSCOPY AS WELL AS A DIAGNOSTIC COLONOSCOPY.

## 2020-10-29 NOTE — Progress Notes (Addendum)
Gastroenterology Pre-Procedure Review  Request Date: 10/29/2020 Requesting Physician: Rowan Blase, PA-C @ [facility], no previous TCS, family hx of colon cancer (mother)  PATIENT REVIEW QUESTIONS: The patient responded to the following health history questions as indicated:    1. Diabetes Melitis: no 2. Joint replacements in the past 12 months: no 3. Major health problems in the past 3 months: no 4. Has an artificial valve or MVP: no 5. Has a defibrillator: no 6. Has been advised in past to take antibiotics in advance of a procedure like teeth cleaning: no 7. Family history of colon cancer: yes, mother: age unknown  24. Alcohol Use: no 9. Illicit drug Use: no 10. History of sleep apnea: no  11. History of coronary artery or other vascular stents placed within the last 12 months: no 12. History of any prior anesthesia complications: no 13. Body mass index is 29.58 kg/m.    MEDICATIONS & ALLERGIES:    Patient reports the following regarding taking any blood thinners:   Plavix? no Aspirin? yes, 81 mg Coumadin? no Brilinta? no Xarelto? no Eliquis? no Pradaxa? no Savaysa? no Effient? no  Patient confirms/reports the following medications:  Current Outpatient Medications  Medication Sig Dispense Refill  . acetaminophen (TYLENOL) 500 MG tablet Take 1,500 mg by mouth as needed for mild pain or headache.    Marland Kitchen aspirin 81 MG tablet Take 81 mg by mouth daily.    . fish oil-omega-3 fatty acids 1000 MG capsule Take 2 g by mouth daily.    Marland Kitchen ibuprofen (ADVIL,MOTRIN) 200 MG tablet Take 600 mg by mouth as needed for mild pain.    Marland Kitchen lisinopril (PRINIVIL,ZESTRIL) 5 MG tablet Take 5 mg by mouth daily.  0  . Multiple Vitamins-Minerals (MULTIVITAMIN WITH MINERALS) tablet Take 1 tablet by mouth daily.    . nitroGLYCERIN (NITROSTAT) 0.4 MG SL tablet Place 1 tablet (0.4 mg total) under the tongue every 5 (five) minutes as needed. 25 tablet 3  . rosuvastatin (CRESTOR) 20 MG tablet Take 20 mg by mouth daily.      No current facility-administered medications for this visit.    Patient confirms/reports the following allergies:  No Known Allergies  No orders of the defined types were placed in this encounter.   AUTHORIZATION INFORMATION Primary Insurance: Unknown Jim #:[id number] ,  Group #: [id number] Pre-Cert / Josem Kaufmann required: No, not required per automated system Pre-Cert / Auth #: Ref#: [id number]  SCHEDULE INFORMATION: Procedure has been scheduled as follows:  Date: 11/30/2020, Time: AM procedure  Location: [facility] with Dr. Abbey Chatters  This Gastroenterology Pre-Precedure Review Form is being routed to the following provider(s): Neil Crouch, PA

## 2020-11-02 ENCOUNTER — Telehealth: Payer: Self-pay | Admitting: *Deleted

## 2020-11-02 NOTE — Progress Notes (Addendum)
Wife called on Friday and requested to reschedule pt's procedure to 12/07/2020 AM procedure.  Rescheduled Covid test to 12/04/2020 at 3:45.  She was made aware that I would mail out new prep instructions if triage approved.  She voiced understanding.

## 2020-11-02 NOTE — Telephone Encounter (Signed)
Wife called on Friday and requested to reschedule pt's procedure to 12/07/2020 AM procedure.  Rescheduled Covid test to 12/04/2020 at 3:45.  She was made aware that I would mail out new prep instructions if triage approved.  She voiced understanding.

## 2020-11-03 NOTE — Progress Notes (Signed)
Ok to schedule.  ASA II 

## 2020-11-27 ENCOUNTER — Other Ambulatory Visit (HOSPITAL_COMMUNITY): Payer: Managed Care, Other (non HMO)

## 2020-12-04 ENCOUNTER — Other Ambulatory Visit (HOSPITAL_COMMUNITY)
Admission: RE | Admit: 2020-12-04 | Discharge: 2020-12-04 | Disposition: A | Discharge status: Home / Self Care | Payer: Managed Care, Other (non HMO) | Source: Ambulatory Visit | Attending: Internal Medicine | Admitting: Internal Medicine

## 2020-12-04 ENCOUNTER — Other Ambulatory Visit: Payer: Self-pay

## 2020-12-04 DIAGNOSIS — Z20822 Contact with and (suspected) exposure to covid-19: Secondary | ICD-10-CM | POA: Insufficient documentation

## 2020-12-04 DIAGNOSIS — Z01812 Encounter for preprocedural laboratory examination: Secondary | ICD-10-CM | POA: Insufficient documentation

## 2020-12-05 LAB — SARS CORONAVIRUS 2 (TAT 6-24 HRS): SARS Coronavirus 2: NEGATIVE

## 2020-12-07 ENCOUNTER — Ambulatory Visit (HOSPITAL_COMMUNITY)
Admission: RE | Admit: 2020-12-07 | Discharge: 2020-12-07 | Disposition: A | Discharge status: Home / Self Care | Payer: Managed Care, Other (non HMO) | Attending: Internal Medicine | Admitting: Internal Medicine

## 2020-12-07 ENCOUNTER — Other Ambulatory Visit: Payer: Self-pay

## 2020-12-07 ENCOUNTER — Ambulatory Visit (HOSPITAL_COMMUNITY): Payer: Managed Care, Other (non HMO) | Admitting: Anesthesiology

## 2020-12-07 ENCOUNTER — Encounter (HOSPITAL_COMMUNITY): Payer: Self-pay

## 2020-12-07 ENCOUNTER — Encounter (HOSPITAL_COMMUNITY)
Admission: RE | Disposition: A | Discharge status: Home / Self Care | Payer: Self-pay | Source: Home / Self Care | Attending: Internal Medicine

## 2020-12-07 DIAGNOSIS — Z79890 Hormone replacement therapy (postmenopausal): Secondary | ICD-10-CM | POA: Diagnosis not present

## 2020-12-07 DIAGNOSIS — K5730 Diverticulosis of large intestine without perforation or abscess without bleeding: Secondary | ICD-10-CM | POA: Diagnosis not present

## 2020-12-07 DIAGNOSIS — Z1211 Encounter for screening for malignant neoplasm of colon: Secondary | ICD-10-CM | POA: Diagnosis not present

## 2020-12-07 DIAGNOSIS — K648 Other hemorrhoids: Secondary | ICD-10-CM | POA: Diagnosis not present

## 2020-12-07 DIAGNOSIS — Z800 Family history of malignant neoplasm of digestive organs: Secondary | ICD-10-CM | POA: Diagnosis not present

## 2020-12-07 DIAGNOSIS — Z7982 Long term (current) use of aspirin: Secondary | ICD-10-CM | POA: Insufficient documentation

## 2020-12-07 DIAGNOSIS — Z79899 Other long term (current) drug therapy: Secondary | ICD-10-CM | POA: Diagnosis not present

## 2020-12-07 HISTORY — PX: COLONOSCOPY WITH PROPOFOL: SHX5780

## 2020-12-07 SURGERY — COLONOSCOPY WITH PROPOFOL
Anesthesia: General

## 2020-12-07 MED ORDER — PROPOFOL 10 MG/ML IV BOLUS
INTRAVENOUS | Status: DC | PRN
  Administered 2020-12-07: 50 mg via INTRAVENOUS

## 2020-12-07 MED ORDER — CHLORHEXIDINE GLUCONATE CLOTH 2 % EX PADS: 6.0000 | MEDICATED_PAD | Freq: Once | CUTANEOUS | Status: DC

## 2020-12-07 MED ORDER — LACTATED RINGERS IV SOLN: INTRAVENOUS | Status: DC

## 2020-12-07 MED ORDER — PROPOFOL 500 MG/50ML IV EMUL
INTRAVENOUS | Status: DC | PRN
  Administered 2020-12-07: 150 ug/kg/min via INTRAVENOUS

## 2020-12-07 NOTE — Op Note (Signed)
[facility] Patient Name: Russell Cisneros Procedure Date: 12/07/2020 7:09 AM MRN: [id number] Date of Birth: [birth year] Attending MD: Elon Alas. Abbey Chatters DO CSN: [id number] Age: 55 Admit Type: Outpatient Procedure:                Colonoscopy Indications:              Screening for colorectal malignant neoplasm Providers:                Elon Alas. Abbey Chatters, DO, Gwenlyn Fudge, RN, Dereck Leep, Technician Referring MD:              Medicines:                See the Anesthesia note for documentation of the                            administered medications Complications:            No immediate complications. Estimated Blood Loss:     Estimated blood loss: none. Procedure:                Pre-Anesthesia Assessment:                           - The anesthesia plan was to use monitored                            anesthesia care (MAC).                           After obtaining informed consent, the colonoscope                            was passed under direct vision. Throughout the                            procedure, the patient's blood pressure, pulse, and                            oxygen saturations were monitored continuously. The                            [id number]-[id number] ([id number]) scope was introduced through                            the anus and advanced to the the terminal ileum,                            with identification of the appendiceal orifice and                            IC valve. The colonoscopy was performed without                            difficulty. The patient tolerated  the procedure                            well. The quality of the bowel preparation was                            evaluated using the BBPS [facility] Bowel Preparation                            Scale) with scores of: Right Colon = 2 (minor                            amount of residual staining, small fragments of                            stool and/or opaque  liquid, but mucosa seen well),                            Transverse Colon = 2 (minor amount of residual                            staining, small fragments of stool and/or opaque                            liquid, but mucosa seen well) and Left Colon = 2                            (minor amount of residual staining, small fragments                            of stool and/or opaque liquid, but mucosa seen                            well). The total BBPS score equals 6. The quality                            of the bowel preparation was fair. Scope In: 7:46:06 AM Scope Out: 8:02:42 AM Scope Withdrawal Time: 0 hours 13 minutes 51 seconds  Total Procedure Duration: 0 hours 16 minutes 36 seconds  Findings:      The perianal and digital rectal examinations were normal.      Non-bleeding internal hemorrhoids were found during endoscopy.      Multiple small-mouthed diverticula were found in the sigmoid colon and       descending colon.      The terminal ileum appeared normal. Impression:               - Preparation of the colon was fair.                           - Non-bleeding internal hemorrhoids.                           - Diverticulosis in the sigmoid colon and in the  descending colon.                           - The examined portion of the ileum was normal.                           - No specimens collected. Moderate Sedation:      Per Anesthesia Care Recommendation:           - Patient has a contact number available for                            emergencies. The signs and symptoms of potential                            delayed complications were discussed with the                            patient. Return to normal activities tomorrow.                            Written discharge instructions were provided to the                            patient.                           - Resume previous diet.                           - Continue present  medications.                           - Repeat colonoscopy in 10 years for screening                            purposes.                           - Return to GI clinic PRN. Procedure Code(s):        --- Professional ---                           S0630, Colorectal cancer screening; colonoscopy on                            individual not meeting criteria for high risk Diagnosis Code(s):        --- Professional ---                           Z12.11, Encounter for screening for malignant                            neoplasm of colon                           K64.8, Other hemorrhoids  K57.30, Diverticulosis of large intestine without                            perforation or abscess without bleeding CPT copyright 2019 American Medical Association. All rights reserved. The codes documented in this report are preliminary and upon coder review may  be revised to meet current compliance requirements. Elon Alas. Abbey Chatters, DO [place] Abbey Chatters, DO 12/07/2020 8:06:57 AM This report has been signed electronically. Number of Addenda: 0

## 2020-12-07 NOTE — H&P (Signed)
Primary Care Physician:  Redmond School, MD Primary Gastroenterologist:  Dr. Abbey Chatters  Pre-Procedure History & Physical: HPI:  Russell Cisneros is a 55 y.o. male is here for a colonoscopy for colon cancer screening purposes.  Patient reports colon cancer history in his mother.  No melena or hematochezia.  No abdominal pain or unintentional weight loss.  No change in bowel habits.  Overall feels well from a GI standpoint.  Past Medical History:  Diagnosis Date  . Hyperlipidemia   . Hypertension     Past Surgical History:  Procedure Laterality Date  . HERNIA REPAIR Right 10/24/12   [facility]    Prior to Admission medications   Medication Sig Start Date End Date Taking? Authorizing Provider  acetaminophen (TYLENOL) 500 MG tablet Take 1,500 mg by mouth as needed for mild pain or headache.   Yes [provider]  aspirin 81 MG tablet Take 81 mg by mouth daily.   Yes [provider]  fish oil-omega-3 fatty acids 1000 MG capsule Take 2 g by mouth daily.   Yes [provider]  ibuprofen (ADVIL,MOTRIN) 200 MG tablet Take 600 mg by mouth as needed for mild pain.   Yes [provider]  lisinopril (PRINIVIL,ZESTRIL) 5 MG tablet Take 5 mg by mouth daily. 07/22/15  Yes [provider]  loratadine (CLARITIN) 10 MG tablet Take 10 mg by mouth daily.   Yes [provider]  Multiple Vitamins-Minerals (MULTIVITAMIN WITH MINERALS) tablet Take 1 tablet by mouth daily.   Yes [provider]  oxymetazoline (AFRIN) 0.05 % nasal spray Place 1 spray into both nostrils 2 (two) times daily as needed for congestion.   Yes [provider]  rosuvastatin (CRESTOR) 20 MG tablet Take 20 mg by mouth daily.   Yes [provider]  TESTOSTERONE COMPOUNDING KIT TD Place 1 application onto the skin daily. 5%   Yes [provider]  nitroGLYCERIN (NITROSTAT) 0.4 MG SL tablet Place 1 tablet (0.4 mg total) under the tongue every 5 (five) minutes as  needed. 10/19/15   Fay Records, MD    Allergies as of 11/03/2020  . (No Known Allergies)    Family History  Problem Relation Age of Onset  . Heart disease Father        heart attack    Social History   Socioeconomic History  . Marital status: Married    Spouse name: Not on file  . Number of children: Not on file  . Years of education: Not on file  . Highest education level: Not on file  Occupational History  . Not on file  Tobacco Use  . Smoking status: Never Smoker  . Smokeless tobacco: Never Used  Vaping Use  . Vaping Use: Never used  Substance and Sexual Activity  . Alcohol use: No  . Drug use: No  . Sexual activity: Not on file  Other Topics Concern  . Not on file  Social History Narrative  . Not on file   Social Determinants of Health   Financial Resource Strain: Not on file  Food Insecurity: Not on file  Transportation Needs: Not on file  Physical Activity: Not on file  Stress: Not on file  Social Connections: Not on file  Intimate Partner Violence: Not on file    Review of Systems: See HPI, otherwise negative ROS  Physical Exam: Vital signs in last 24 hours: Temp:  [98.5 F (36.9 C)] 98.5 F (36.9 C) (05/02 0714) Pulse Rate:  [105] 105 (05/02 0714)  Resp:  [19] 19 (05/02 0714) BP: (159)/(80) 159/80 (05/02 0714) SpO2:  [98 %] 98 % (05/02 0714)   General:   Alert,  Well-developed, well-nourished, pleasant and cooperative in NAD Head:  Normocephalic and atraumatic. Eyes:  Sclera clear, no icterus.   Conjunctiva pink. Ears:  Normal auditory acuity. Nose:  No deformity, discharge,  or lesions. Mouth:  No deformity or lesions, dentition normal. Neck:  Supple; no masses or thyromegaly. Lungs:  Clear throughout to auscultation.   No wheezes, crackles, or rhonchi. No acute distress. Heart:  Regular rate and rhythm; no murmurs, clicks, rubs,  or gallops. Abdomen:  Soft, nontender and nondistended. No masses, hepatosplenomegaly or hernias noted.  Normal bowel sounds, without guarding, and without rebound.   Msk:  Symmetrical without gross deformities. Normal posture. Extremities:  Without clubbing or edema. Neurologic:  Alert and  oriented x4;  grossly normal neurologically. Skin:  Intact without significant lesions or rashes. Cervical Nodes:  No significant cervical adenopathy. Psych:  Alert and cooperative. Normal mood and affect.  Impression/Plan: Alon Mazor is here for a colonoscopy to be performed for colon cancer screening purposes.  The risks of the procedure including infection, bleed, or perforation as well as benefits, limitations, alternatives and imponderables have been reviewed with the patient. Questions have been answered. All parties agreeable.

## 2020-12-07 NOTE — Anesthesia Preprocedure Evaluation (Addendum)
Anesthesia Evaluation  Patient identified by MRN, date of birth, ID band Patient awake    Reviewed: Allergy & Precautions, NPO status , Patient's Chart, lab work & pertinent test results  History of Anesthesia Complications Negative for: history of anesthetic complications  Airway Mallampati: III  TM Distance: >3 FB Neck ROM: Full   Comment: Snoring   Dental  (+) Dental Advisory Given   Pulmonary sleep apnea (Snoring ) ,    Pulmonary exam normal breath sounds clear to auscultation       Cardiovascular Exercise Tolerance: Good hypertension, Pt. on medications Normal cardiovascular exam Rhythm:Regular Rate:Normal     Neuro/Psych negative neurological ROS     GI/Hepatic Neg liver ROS, GERD  Controlled,  Endo/Other  negative endocrine ROS  Renal/GU negative Renal ROS     Musculoskeletal   Abdominal   Peds  Hematology negative hematology ROS (+)   Anesthesia Other Findings   Reproductive/Obstetrics                            Anesthesia Physical Anesthesia Plan  ASA: II  Anesthesia Plan: General   Post-op Pain Management:    Induction: Intravenous  PONV Risk Score and Plan: Propofol infusion  Airway Management Planned: Nasal Cannula and Natural Airway  Additional Equipment:   Intra-op Plan:   Post-operative Plan:   Informed Consent: I have reviewed the patients History and Physical, chart, labs and discussed the procedure including the risks, benefits and alternatives for the proposed anesthesia with the patient or authorized representative who has indicated his/her understanding and acceptance.     Dental advisory given  Plan Discussed with: CRNA and Surgeon  Anesthesia Plan Comments:         Anesthesia Quick Evaluation

## 2020-12-07 NOTE — Anesthesia Postprocedure Evaluation (Signed)
Anesthesia Post Note  Patient: Russell Cisneros  Procedure(s) Performed: COLONOSCOPY WITH PROPOFOL (N/A )  Patient location during evaluation: Phase II Anesthesia Type: General Level of consciousness: awake, awake and alert, oriented and patient cooperative Pain management: pain level controlled Vital Signs Assessment: post-procedure vital signs reviewed and stable Respiratory status: spontaneous breathing and nonlabored ventilation Cardiovascular status: stable Postop Assessment: no apparent nausea or vomiting Anesthetic complications: no   No complications documented.   Last Vitals:  Vitals:   12/07/20 0714 12/07/20 0805  BP: (!) 159/80   Pulse: (!) 105 78  Resp: 19 15  Temp: 36.9 C 37.2 C  SpO2: 98% 94%    Last Pain:  Vitals:   12/07/20 0805  TempSrc: Axillary  PainSc: 0-No pain                 Rucker Pridgeon

## 2020-12-07 NOTE — Discharge Instructions (Addendum)
Colonoscopy Discharge Instructions  Read the instructions outlined below and refer to this sheet in the next few weeks. These discharge instructions provide you with general information on caring for yourself after you leave the hospital. Your doctor may also give you specific instructions. While your treatment has been planned according to the most current medical practices available, unavoidable complications occasionally occur.   ACTIVITY  You may resume your regular activity, but move at a slower pace for the next 24 hours.   Take frequent rest periods for the next 24 hours.   Walking will help get rid of the air and reduce the bloated feeling in your belly (abdomen).   No driving for 24 hours (because of the medicine (anesthesia) used during the test).    Do not sign any important legal documents or operate any machinery for 24 hours (because of the anesthesia used during the test).  NUTRITION  Drink plenty of fluids.   You may resume your normal diet as instructed by your doctor.   Begin with a light meal and progress to your normal diet. Heavy or fried foods are harder to digest and may make you feel sick to your stomach (nauseated).   Avoid alcoholic beverages for 24 hours or as instructed.  MEDICATIONS  You may resume your normal medications unless your doctor tells you otherwise.  WHAT YOU CAN EXPECT TODAY  Some feelings of bloating in the abdomen.   Passage of more gas than usual.   Spotting of blood in your stool or on the toilet paper.  IF YOU HAD POLYPS REMOVED DURING THE COLONOSCOPY:  No aspirin products for 7 days or as instructed.   No alcohol for 7 days or as instructed.   Eat a soft diet for the next 24 hours.  FINDING OUT THE RESULTS OF YOUR TEST Not all test results are available during your visit. If your test results are not back during the visit, make an appointment with your caregiver to find out the results. Do not assume everything is normal if  you have not heard from your caregiver or the medical facility. It is important for you to follow up on all of your test results.  SEEK IMMEDIATE MEDICAL ATTENTION IF:  You have more than a spotting of blood in your stool.   Your belly is swollen (abdominal distention).   You are nauseated or vomiting.   You have a temperature over 101.   You have abdominal pain or discomfort that is severe or gets worse throughout the day.   Your colonoscopy was relatively unremarkable.  I did not find a polyps or evidence of colon cancer.  You do have diverticulosis and internal hemorrhoids. I would recommend increasing fiber in your diet or adding OTC Benefiber/Metamucil. Be sure to drink at least 4 to 6 glasses of water daily. Follow-up with GI as needed.   I hope you have a great rest of your week!  Elon Alas. Abbey Chatters, D.O. Gastroenterology and Hepatology [facility]  PATIENT INSTRUCTIONS POST-ANESTHESIA  IMMEDIATELY FOLLOWING SURGERY:  Do not drive or operate machinery for the first twenty four hours after surgery.  Do not make any important decisions for twenty four hours after surgery or while taking narcotic pain medications or sedatives.  If you develop intractable nausea and vomiting or a severe headache please notify your doctor immediately.  FOLLOW-UP:  Please make an appointment with your surgeon as instructed. You do not need to follow up with anesthesia unless specifically instructed  to do so.  WOUND CARE INSTRUCTIONS (if applicable):  Keep a dry clean dressing on the anesthesia/puncture wound site if there is drainage.  Once the wound has quit draining you may leave it open to air.  Generally you should leave the bandage intact for twenty four hours unless there is drainage.  If the epidural site drains for more than 36-48 hours please call the anesthesia department.  QUESTIONS?:  Please feel free to call your physician or the hospital operator if you have any  questions, and they will be happy to assist you.      Diverticulosis  Diverticulosis is a condition that develops when small pouches (diverticula) form in the wall of the large intestine (colon). The colon is where water is absorbed and stool (feces) is formed. The pouches form when the inside layer of the colon pushes through weak spots in the outer layers of the colon. You may have a few pouches or many of them. The pouches usually do not cause problems unless they become inflamed or infected. When this happens, the condition is called diverticulitis. What are the causes? The cause of this condition is not known. What increases the risk? The following factors may make you more likely to develop this condition:  Being older than age 1. Your risk for this condition increases with age. Diverticulosis is rare among people younger than age 34. By age 29, many people have it.  Eating a low-fiber diet.  Having frequent constipation.  Being overweight.  Not getting enough exercise.  Smoking.  Taking over-the-counter pain medicines, like aspirin and ibuprofen.  Having a family history of diverticulosis. What are the signs or symptoms? In most people, there are no symptoms of this condition. If you do have symptoms, they may include:  Bloating.  Cramps in the abdomen.  Constipation or diarrhea.  Pain in the lower left side of the abdomen. How is this diagnosed? Because diverticulosis usually has no symptoms, it is most often diagnosed during an exam for other colon problems. The condition may be diagnosed by:  Using a flexible scope to examine the colon (colonoscopy).  Taking an X-ray of the colon after dye has been put into the colon (barium enema).  Having a CT scan. How is this treated? You may not need treatment for this condition. Your health care provider may recommend treatment to prevent problems. You may need treatment if you have symptoms or if you previously had  diverticulitis. Treatment may include:  Eating a high-fiber diet.  Taking a fiber supplement.  Taking a live bacteria supplement (probiotic).  Taking medicine to relax your colon.   Follow these instructions at home: Medicines  Take over-the-counter and prescription medicines only as told by your health care provider.  If told by your health care provider, take a fiber supplement or probiotic. Constipation prevention Your condition may cause constipation. To prevent or treat constipation, you may need to:  Drink enough fluid to keep your urine pale yellow.  Take over-the-counter or prescription medicines.  Eat foods that are high in fiber, such as beans, whole grains, and fresh fruits and vegetables.  Limit foods that are high in fat and processed sugars, such as fried or sweet foods.   General instructions  Try not to strain when you have a bowel movement.  Keep all follow-up visits as told by your health care provider. This is important. Contact a health care provider if you:  Have pain in your abdomen.  Have bloating.  Have cramps.  Have not had a bowel movement in 3 days. Get help right away if:  Your pain gets worse.  Your bloating becomes very bad.  You have a fever or chills, and your symptoms suddenly get worse.  You vomit.  You have bowel movements that are bloody or black.  You have bleeding from your rectum. Summary  Diverticulosis is a condition that develops when small pouches (diverticula) form in the wall of the large intestine (colon).  You may have a few pouches or many of them.  This condition is most often diagnosed during an exam for other colon problems.  Treatment may include increasing the fiber in your diet, taking supplements, or taking medicines. This information is not intended to replace advice given to you by your health care provider. Make sure you discuss any questions you have with your health care provider. Document  Revised: 02/21/2019 Document Reviewed: 02/21/2019 Elsevier Patient Education  2021 [place].    Hemorrhoids Hemorrhoids are swollen veins that may develop:  In the butt (rectum). These are called internal hemorrhoids.  Around the opening of the butt (anus). These are called external hemorrhoids. Hemorrhoids can cause pain, itching, or bleeding. Most of the time, they do not cause serious problems. They usually get better with diet changes, lifestyle changes, and other home treatments. What are the causes? This condition may be caused by:  Having trouble pooping (constipation).  Pushing hard (straining) to poop.  Watery poop (diarrhea).  Pregnancy.  Being very overweight (obese).  Sitting for long periods of time.  Heavy lifting or other activity that causes you to strain.  Anal sex.  Riding a bike for a long period of time. What are the signs or symptoms? Symptoms of this condition include:  Pain.  Itching or soreness in the butt.  Bleeding from the butt.  Leaking poop.  Swelling in the area.  One or more lumps around the opening of your butt. How is this diagnosed? A doctor can often diagnose this condition by looking at the affected area. The doctor may also:  Do an exam that involves feeling the area with a gloved hand (digital rectal exam).  Examine the area inside your butt using a small tube (anoscope).  Order blood tests. This may be done if you have lost a lot of blood.  Have you get a test that involves looking inside the colon using a flexible tube with a camera on the end (sigmoidoscopy or colonoscopy). How is this treated? This condition can usually be treated at home. Your doctor may tell you to change what you eat, make lifestyle changes, or try home treatments. If these do not help, procedures can be done to remove the hemorrhoids or make them smaller. These may involve:  Placing rubber bands at the base of the hemorrhoids to cut off  their blood supply.  Injecting medicine into the hemorrhoids to shrink them.  Shining a type of light energy onto the hemorrhoids to cause them to fall off.  Doing surgery to remove the hemorrhoids or cut off their blood supply. Follow these instructions at home: Eating and drinking  Eat foods that have a lot of fiber in them. These include whole grains, beans, nuts, fruits, and vegetables.  Ask your doctor about taking products that have added fiber (fibersupplements).  Reduce the amount of fat in your diet. You can do this by: ? Eating low-fat dairy products. ? Eating less red meat. ? Avoiding processed foods.  Drink  enough fluid to keep your pee (urine) pale yellow.   Managing pain and swelling  Take a warm-water bath (sitz bath) for 20 minutes to ease pain. Do this 3-4 times a day. You may do this in a bathtub or using a portable sitz bath that fits over the toilet.  If told, put ice on the painful area. It may be helpful to use ice between your warm baths. ? Put ice in a plastic bag. ? Place a towel between your skin and the bag. ? Leave the ice on for 20 minutes, 2-3 times a day.   General instructions  Take over-the-counter and prescription medicines only as told by your doctor. ? Medicated creams and medicines may be used as told.  Exercise often. Ask your doctor how much and what kind of exercise is best for you.  Go to the bathroom when you have the urge to poop. Do not wait.  Avoid pushing too hard when you poop.  Keep your butt dry and clean. Use wet toilet paper or moist towelettes after pooping.  Do not sit on the toilet for a long time.  Keep all follow-up visits as told by your doctor. This is important. Contact a doctor if you:  Have pain and swelling that do not get better with treatment or medicine.  Have trouble pooping.  Cannot poop.  Have pain or swelling outside the area of the hemorrhoids. Get help right away if you have:  Bleeding that  will not stop. Summary  Hemorrhoids are swollen veins in the butt or around the opening of the butt.  They can cause pain, itching, or bleeding.  Eat foods that have a lot of fiber in them. These include whole grains, beans, nuts, fruits, and vegetables.  Take a warm-water bath (sitz bath) for 20 minutes to ease pain. Do this 3-4 times a day. This information is not intended to replace advice given to you by your health care provider. Make sure you discuss any questions you have with your health care provider. Document Revised: 08/02/2018 Document Reviewed: 12/14/2017 Elsevier Patient Education  2021 [place].    High-Fiber Eating Plan Fiber, also called dietary fiber, is a type of carbohydrate. It is found foods such as fruits, vegetables, whole grains, and beans. A high-fiber diet can have many health benefits. Your health care provider may recommend a high-fiber diet to help:  Prevent constipation. Fiber can make your bowel movements more regular.  Lower your cholesterol.  Relieve the following conditions: ? Inflammation of veins in the anus (hemorrhoids). ? Inflammation of specific areas of the digestive tract (uncomplicated diverticulosis). ? A problem of the large intestine, also called the colon, that sometimes causes pain and diarrhea (irritable bowel syndrome, or IBS).  Prevent overeating as part of a weight-loss plan.  Prevent heart disease, type 2 diabetes, and certain cancers. What are tips for following this plan? Reading food labels  Check the nutrition facts label on food products for the amount of dietary fiber. Choose foods that have 5 grams of fiber or more per serving.  The goals for recommended daily fiber intake include: ? Men (age 110 or younger): 34-38 g. ? Men (over age 27): 28-34 g. ? Women (age 65 or younger): 25-28 g. ? Women (over age 48): 22-25 g. Your daily fiber goal is _____________ g.   Shopping  Choose whole fruits and vegetables  instead of processed forms, such as apple juice or applesauce.  Choose a wide variety of high-fiber  foods such as avocados, lentils, oats, and kidney beans.  Read the nutrition facts label of the foods you choose. Be aware of foods with added fiber. These foods often have high sugar and sodium amounts per serving. Cooking  Use whole-grain flour for baking and cooking.  Cook with brown rice instead of white rice. Meal planning  Start the day with a breakfast that is high in fiber, such as a cereal that contains 5 g of fiber or more per serving.  Eat breads and cereals that are made with whole-grain flour instead of refined flour or white flour.  Eat brown rice, bulgur wheat, or millet instead of white rice.  Use beans in place of meat in soups, salads, and pasta dishes.  Be sure that half of the grains you eat each day are whole grains. General information  You can get the recommended daily intake of dietary fiber by: ? Eating a variety of fruits, vegetables, grains, nuts, and beans. ? Taking a fiber supplement if you are not able to take in enough fiber in your diet. It is better to get fiber through food than from a supplement.  Gradually increase how much fiber you consume. If you increase your intake of dietary fiber too quickly, you may have bloating, cramping, or gas.  Drink plenty of water to help you digest fiber.  Choose high-fiber snacks, such as berries, raw vegetables, nuts, and popcorn. What foods should I eat? Fruits Berries. Pears. Apples. Oranges. Avocado. Prunes and raisins. Dried figs. Vegetables Sweet potatoes. Spinach. Kale. Artichokes. Cabbage. Broccoli. Cauliflower. Green peas. Carrots. Squash. Grains Whole-grain breads. Multigrain cereal. Oats and oatmeal. Brown rice. Barley. Bulgur wheat. [place]. Quinoa. Bran muffins. Popcorn. Rye wafer crackers. Meats and other proteins Navy beans, kidney beans, and pinto beans. Soybeans. Split peas. Lentils. Nuts and  seeds. Dairy Fiber-fortified yogurt. Beverages Fiber-fortified soy milk. Fiber-fortified orange juice. Other foods Fiber bars. The items listed above may not be a complete list of recommended foods and beverages. Contact a dietitian for more information. What foods should I avoid? Fruits Fruit juice. Cooked, strained fruit. Vegetables Fried potatoes. Canned vegetables. Well-cooked vegetables. Grains White bread. Pasta made with refined flour. White rice. Meats and other proteins Fatty cuts of meat. Fried chicken or fried fish. Dairy Milk. Yogurt. Cream cheese. Sour cream. Fats and oils Butters. Beverages Soft drinks. Other foods Cakes and pastries. The items listed above may not be a complete list of foods and beverages to avoid. Talk with your dietitian about what choices are best for you. Summary  Fiber is a type of carbohydrate. It is found in foods such as fruits, vegetables, whole grains, and beans.  A high-fiber diet has many benefits. It can help to prevent constipation, lower blood cholesterol, aid weight loss, and reduce your risk of heart disease, diabetes, and certain cancers.  Increase your intake of fiber gradually. Increasing fiber too quickly may cause cramping, bloating, and gas. Drink plenty of water while you increase the amount of fiber you consume.  The best sources of fiber include whole fruits and vegetables, whole grains, nuts, seeds, and beans. This information is not intended to replace advice given to you by your health care provider. Make sure you discuss any questions you have with your health care provider. Document Revised: 11/28/2019 Document Reviewed: 11/28/2019 Elsevier Patient Education  2021 [employer].

## 2020-12-07 NOTE — Transfer of Care (Signed)
Immediate Anesthesia Transfer of Care Note  Patient: Russell Cisneros  Procedure(s) Performed: COLONOSCOPY WITH PROPOFOL (N/A )  Patient Location: PACU  Anesthesia Type:General  Level of Consciousness: awake, alert  and oriented  Airway & Oxygen Therapy: Patient Spontanous Breathing  Post-op Assessment: Report given to RN, Post -op Vital signs reviewed and stable and Patient moving all extremities X 4  Post vital signs: Reviewed and stable  Last Vitals:  Vitals Value Taken Time  BP    Temp    Pulse    Resp    SpO2      Last Pain:  Vitals:   12/07/20 0743  TempSrc:   PainSc: 0-No pain      Patients Stated Pain Goal: 3 (09/32/35 5732)  Complications: No complications documented.

## 2020-12-15 ENCOUNTER — Encounter (HOSPITAL_COMMUNITY): Payer: Self-pay | Admitting: Internal Medicine
